# Patient Record
Sex: Male | Born: 2000 | Race: White | Hispanic: No | Marital: Single | Smoking: Never smoker
Health system: Southern US, Community
[De-identification: ages and names within clinical notes are randomized; demographics above are authoritative.]

## PROBLEM LIST (undated history)

## (undated) DIAGNOSIS — J45909 Unspecified asthma, uncomplicated: Secondary | ICD-10-CM

## (undated) HISTORY — DX: Unspecified asthma, uncomplicated: J45.909

## (undated) HISTORY — PX: TYMPANOSTOMY: SHX2586

## (undated) HISTORY — PX: CIRCUMCISION: SUR203

## (undated) HISTORY — PX: TYMPANOSTOMY TUBE PLACEMENT: SHX32

---
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
## 2020-06-22 NOTE — Progress Notes (Signed)
Phone [phone] In person visit   Subjective:   Terry Logan is a 20 y.o. year old very pleasant male patient who presents for/with See problem oriented charting Chief Complaint  Patient presents with  . Urinary Frequency    Pt c/o frequency with urination x 3 weeks off and on. Denies back pain, fever or chills.   This visit occurred during the SARS-CoV-2 public health emergency.  Safety protocols were in place, including screening questions prior to the visit, additional usage of staff PPE, and extensive cleaning of exam room while observing appropriate contact time as indicated for disinfecting solutions.   Past Medical History-  Patient Active Problem List   Diagnosis Date Noted  . Mild concussion 11/19/2017  . Gynecomastia 08/14/2017  . 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 Asthma     Medications- reviewed and updated Current Outpatient Medications  Medication Sig Dispense Refill  . clindamycin (CLEOCIN-T) 1 % external solution Apply topically 2 (two) times daily. Apply to buttock area 180 mL 2   No current facility-administered medications for this visit.     Objective:  BP 122/80 (BP Location: Left Arm, Patient Position: Sitting, Cuff Size: Normal)   Pulse 82   Temp 98.4 F (36.9 C) (Temporal)   Ht 6' (1.829 m)   Wt 197 lb 4 oz (89.5 kg)   SpO2 97%   BMI 26.75 kg/m  Gen: NAD, resting comfortably CV: RRR no murmurs rubs or gallops Lungs: CTAB no crackles, wheeze, rhonchi Abdomen: soft/nontender/nondistended/normal bowel sounds. No rebound or guarding.  Ext: no edema Skin: warm, dry, multiple papules and scarred lesions throughout buttocks-a few scattered lesions in the groin-patient reports abscesses that come and go Rectal: normal tone, normal sized prostate, no masses or tenderness   Results for orders placed or performed in visit on 06/23/20 (from the past 24 hour(s))  POCT urinalysis dipstick     Status: Normal   Collection Time: 06/23/20  3:11 PM  Result  Value Ref Range   Color, UA yellow    Clarity, UA clear    Glucose, UA Negative Negative   Bilirubin, UA negative    Ketones, UA negative    Spec Grav, UA 1.020 1.010 - 1.025   Blood, UA negative    pH, UA 6.0 5.0 - 8.0   Protein, UA Negative Negative   Urobilinogen, UA 0.2 0.2 or 1.0 E.U./dL   Nitrite, UA negative    Leukocytes, UA Negative Negative   Appearance     Odor         Assessment and Plan   #Social update- 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 somedays can have normal amount of urination and then other days back to the every 30 minutes. About the same as wen it started - some right low back pain but not new Complains of dysuria: no; polyuria: yes; nocturia: perhaps once a night before he can fall asleep which is abnormal; urgency: no. - no penile discharge. Dating but not sexually active- no oral or vaginal intercourse.    ROS- no fever, chills, nausea, vomiting, flank pain. No blood in urine.  A/P: UA reassuring.  -he is worried about diabetes-we will check A1c with urinary frequency -Has had some low back pain since football years-we will  also check CBC and CMP -We will get urine culture to rule out infection.  Hold off on antibiotics at this time -Prostate examination without tenderness or bogginess-do not suspect prostatitis at present Patient to follow up if new or worsening symptoms or failure to improve.   #Possible hidradenitis suppurativa-trial clindamycin for multiple papules and abscesses over time antibiotics- could consider oral antibiotics if not improving but will give at least a month  #Intentional weight loss-congratulated patient on being down 50 pounds from 2020-this has been intentional working  out regularly multiple times a week for an hour, watching what he eats- less portion size.  No recent significant worsening since he started the frequent urination- do not strongly suspect diabetes   Recommended follow up: Return for as needed for new, worsening, persistent symptoms. Future Appointments  Date Time Provider Department Center  12/15/2020  2:40 PM Shelva Majestic, MD [facility]    Lab/Order associations:   ICD-10-CM   1. Frequent urination  R35.0 Urine Culture    POCT urinalysis dipstick    CBC with Differential/Platelet    Comprehensive metabolic panel    Hemoglobin A1c    Hemoglobin A1c    Comprehensive metabolic panel    CBC with Differential/Platelet    CANCELED: CBC with Differential/Platelet    CANCELED: Comprehensive metabolic panel    CANCELED: Hemoglobin A1c  2. Chronic right-sided low back pain without sciatica  M54.50 CBC with Differential/Platelet   G89.29 Comprehensive metabolic panel    Comprehensive metabolic panel    CBC with Differential/Platelet    CANCELED: CBC with Differential/Platelet    CANCELED: Comprehensive metabolic panel    Meds ordered this encounter  Medications  . clindamycin (CLEOCIN-T) 1 % external solution    Sig: Apply topically 2 (two) times daily. Apply to buttock area    Dispense:  180 mL    Refill:  2    Time Spent: 22 minutes of total time (3: 40 PM- 4:02 PM) was spent on the date of the encounter performing the following actions: chart review prior to seeing the patient, obtaining history, performing a medically necessary exam, counseling on the treatment plan, placing orders, and documenting in our EHR.   Return precautions advised.  Tana Conch, MD

## 2020-06-22 NOTE — Patient Instructions (Addendum)
Health Maintenance Due  Topic Date Due  . COVID-19 Vaccine (3 - Booster)- send [place] the date of #3 if you can  10/09/2019   congrats on weight loss!   Please stop by lab before you go If you have mychart- we will send your results within 3 business days of [place] receiving them.  If you do not have mychart- we will call you about results within 5 business days of [place] receiving them.  *please also note that you will see labs on mychart as soon as they post. I will later go in and write notes on them- will say "notes from Dr. Durene Cal"

## 2020-06-23 ENCOUNTER — Encounter: Payer: Self-pay | Admitting: Family Medicine

## 2020-06-23 ENCOUNTER — Ambulatory Visit (INDEPENDENT_AMBULATORY_CARE_PROVIDER_SITE_OTHER): Payer: 59 | Admitting: Family Medicine

## 2020-06-23 ENCOUNTER — Other Ambulatory Visit: Payer: Self-pay

## 2020-06-23 VITALS — BP 122/80 | HR 82 | Temp 98.4°F | Ht 72.0 in | Wt 197.2 lb

## 2020-06-23 DIAGNOSIS — G8929 Other chronic pain: Secondary | ICD-10-CM

## 2020-06-23 DIAGNOSIS — R350 Frequency of micturition: Secondary | ICD-10-CM

## 2020-06-23 DIAGNOSIS — M545 Low back pain, unspecified: Secondary | ICD-10-CM

## 2020-06-23 LAB — POCT URINALYSIS DIPSTICK
Bilirubin, UA: NEGATIVE
Blood, UA: NEGATIVE
Glucose, UA: NEGATIVE
Ketones, UA: NEGATIVE
Leukocytes, UA: NEGATIVE
Nitrite, UA: NEGATIVE
Protein, UA: NEGATIVE
Spec Grav, UA: 1.02 (ref 1.010–1.025)
Urobilinogen, UA: 0.2 E.U./dL
pH, UA: 6 (ref 5.0–8.0)

## 2020-06-23 MED ORDER — CLINDAMYCIN PHOSPHATE 1 % EX SOLN: Freq: Two times a day (BID) | CUTANEOUS | 2 refills | Status: AC

## 2020-06-24 LAB — COMPREHENSIVE METABOLIC PANEL
AG Ratio: 1.7 (calc) (ref 1.0–2.5)
ALT: 10 U/L (ref 9–46)
AST: 12 U/L (ref 10–40)
Albumin: 4.7 g/dL (ref 3.6–5.1)
Alkaline phosphatase (APISO): 50 U/L (ref 36–130)
BUN: 14 mg/dL (ref 7–25)
CO2: 23 mmol/L (ref 20–32)
Calcium: 9.6 mg/dL (ref 8.6–10.3)
Chloride: 104 mmol/L (ref 98–110)
Creat: 1.3 mg/dL (ref 0.60–1.35)
Globulin: 2.8 g/dL (calc) (ref 1.9–3.7)
Glucose, Bld: 69 mg/dL (ref 65–99)
Potassium: 4.1 mmol/L (ref 3.5–5.3)
Sodium: 140 mmol/L (ref 135–146)
Total Bilirubin: 0.7 mg/dL (ref 0.2–1.2)
Total Protein: 7.5 g/dL (ref 6.1–8.1)

## 2020-06-24 LAB — CBC WITH DIFFERENTIAL/PLATELET
Absolute Monocytes: 848 cells/uL (ref 200–950)
Basophils Absolute: 53 cells/uL (ref 0–200)
Basophils Relative: 0.5 %
Eosinophils Absolute: 42 cells/uL (ref 15–500)
Eosinophils Relative: 0.4 %
HCT: 46.4 % (ref 38.5–50.0)
Hemoglobin: 15.2 g/dL (ref 13.2–17.1)
Lymphs Abs: 2682 cells/uL (ref 850–3900)
MCH: 26.6 pg — ABNORMAL LOW (ref 27.0–33.0)
MCHC: 32.8 g/dL (ref 32.0–36.0)
MCV: 81.1 fL (ref 80.0–100.0)
MPV: 10.8 fL (ref 7.5–12.5)
Monocytes Relative: 8 %
Neutro Abs: 6975 cells/uL (ref 1500–7800)
Neutrophils Relative %: 65.8 %
Platelets: 293 10*3/uL (ref 140–400)
RBC: 5.72 10*6/uL (ref 4.20–5.80)
RDW: 13.4 % (ref 11.0–15.0)
Total Lymphocyte: 25.3 %
WBC: 10.6 10*3/uL (ref 3.8–10.8)

## 2020-06-24 LAB — HEMOGLOBIN A1C
Hgb A1c MFr Bld: 5.1 % of total Hgb (ref <5.7)
Mean Plasma Glucose: 100 mg/dL
eAG (mmol/L): 5.5 mmol/L

## 2020-06-24 LAB — URINE CULTURE
MICRO NUMBER:: 11943791
Result:: NO GROWTH
SPECIMEN QUALITY:: ADEQUATE

## 2020-11-11 ENCOUNTER — Ambulatory Visit
Admission: EM | Admit: 2020-11-11 | Discharge: 2020-11-11 | Disposition: A | Discharge status: Home / Self Care | Payer: 59 | Source: Ambulatory Visit

## 2020-11-11 ENCOUNTER — Emergency Department (HOSPITAL_COMMUNITY)
Admission: EM | Admit: 2020-11-11 | Discharge: 2020-11-12 | Disposition: A | Discharge status: Home / Self Care | Payer: 59 | Attending: Emergency Medicine | Admitting: Emergency Medicine

## 2020-11-11 ENCOUNTER — Ambulatory Visit
Admission: RE | Admit: 2020-11-11 | Discharge: 2020-11-11 | Disposition: A | Discharge status: Home / Self Care | Payer: 59 | Source: Ambulatory Visit

## 2020-11-11 ENCOUNTER — Encounter (HOSPITAL_COMMUNITY): Payer: Self-pay | Admitting: Emergency Medicine

## 2020-11-11 ENCOUNTER — Other Ambulatory Visit: Payer: Self-pay

## 2020-11-11 DIAGNOSIS — R197 Diarrhea, unspecified: Secondary | ICD-10-CM | POA: Insufficient documentation

## 2020-11-11 DIAGNOSIS — R140 Abdominal distension (gaseous): Secondary | ICD-10-CM | POA: Insufficient documentation

## 2020-11-11 DIAGNOSIS — F17210 Nicotine dependence, cigarettes, uncomplicated: Secondary | ICD-10-CM | POA: Diagnosis not present

## 2020-11-11 DIAGNOSIS — R143 Flatulence: Secondary | ICD-10-CM | POA: Insufficient documentation

## 2020-11-11 DIAGNOSIS — R1033 Periumbilical pain: Secondary | ICD-10-CM | POA: Diagnosis present

## 2020-11-11 DIAGNOSIS — R202 Paresthesia of skin: Secondary | ICD-10-CM | POA: Diagnosis not present

## 2020-11-11 LAB — COMPREHENSIVE METABOLIC PANEL
ALT: 15 U/L (ref 0–44)
AST: 15 U/L (ref 15–41)
Albumin: 4.3 g/dL (ref 3.5–5.0)
Alkaline Phosphatase: 50 U/L (ref 38–126)
Anion gap: 10 (ref 5–15)
BUN: 9 mg/dL (ref 6–20)
CO2: 21 mmol/L — ABNORMAL LOW (ref 22–32)
Calcium: 9.4 mg/dL (ref 8.9–10.3)
Chloride: 107 mmol/L (ref 98–111)
Creatinine, Ser: 1.11 mg/dL (ref 0.61–1.24)
GFR, Estimated: >60 mL/min (ref >60)
Glucose, Bld: 102 mg/dL — ABNORMAL HIGH (ref 70–99)
Potassium: 3.6 mmol/L (ref 3.5–5.1)
Sodium: 138 mmol/L (ref 135–145)
Total Bilirubin: 0.5 mg/dL (ref 0.3–1.2)
Total Protein: 7.6 g/dL (ref 6.5–8.1)

## 2020-11-11 LAB — URINALYSIS, ROUTINE W REFLEX MICROSCOPIC
Bilirubin Urine: NEGATIVE
Glucose, UA: NEGATIVE mg/dL
Hgb urine dipstick: NEGATIVE
Ketones, ur: NEGATIVE mg/dL
Leukocytes,Ua: NEGATIVE
Nitrite: NEGATIVE
Protein, ur: NEGATIVE mg/dL
Specific Gravity, Urine: 1.018 (ref 1.005–1.030)
pH: 5 (ref 5.0–8.0)

## 2020-11-11 LAB — CBC WITH DIFFERENTIAL/PLATELET
Abs Immature Granulocytes: 0.05 10*3/uL (ref 0.00–0.07)
Basophils Absolute: 0.1 10*3/uL (ref 0.0–0.1)
Basophils Relative: 0 %
Eosinophils Absolute: 0 10*3/uL (ref 0.0–0.5)
Eosinophils Relative: 0 %
HCT: 44.2 % (ref 39.0–52.0)
Hemoglobin: 15 g/dL (ref 13.0–17.0)
Immature Granulocytes: 0 %
Lymphocytes Relative: 26 %
Lymphs Abs: 3.2 10*3/uL (ref 0.7–4.0)
MCH: 28.4 pg (ref 26.0–34.0)
MCHC: 33.9 g/dL (ref 30.0–36.0)
MCV: 83.6 fL (ref 80.0–100.0)
Monocytes Absolute: 0.8 10*3/uL (ref 0.1–1.0)
Monocytes Relative: 6 %
Neutro Abs: 8.1 10*3/uL — ABNORMAL HIGH (ref 1.7–7.7)
Neutrophils Relative %: 68 %
Platelets: 308 10*3/uL (ref 150–400)
RBC: 5.29 MIL/uL (ref 4.22–5.81)
RDW: 13.2 % (ref 11.5–15.5)
WBC: 12.2 10*3/uL — ABNORMAL HIGH (ref 4.0–10.5)
nRBC: 0 % (ref 0.0–0.2)

## 2020-11-11 LAB — LIPASE, BLOOD: Lipase: 30 U/L (ref 11–51)

## 2020-11-11 NOTE — ED Triage Notes (Signed)
Pt reports abd "discomfort" and decreased appetite since 10/4.  Reports facial tingling and bilateral hand tremors since 10/7.  Traveled to [facility] 10/8-10/14.

## 2020-11-11 NOTE — ED Provider Notes (Signed)
Emergency Medicine Provider Triage Evaluation Note  Terry Logan , a 20 y.o. male  was evaluated in triage.  Pt complains of diffuse abd discomfort x 2 weeks with 1 episode of emesis and looser stools. Has been experiencing intermittent tremors to hands bilaterally and a tingling sensation to his bilateral cheeks. No fevers or chills. No previous abd surgeries.   Review of Systems  Positive: + abd pain, nausea, loose stools, shaking Negative: - fevers  Physical Exam  BP (!) 147/83 (BP Location: Left Arm)   Pulse 70   Temp 98.9 F (37.2 C) (Oral)   Resp 18   SpO2 98%  Gen:   Awake, no distress   Resp:  Normal effort  MSK:   Moves extremities without difficulty  Other:  Abd soft and nontender. No tremor appreciated. A&O x 4  Medical Decision Making  Medically screening exam initiated at 4:07 PM.  Appropriate orders placed.  Nyko Gell was informed that the remainder of the evaluation will be completed by another provider, this initial triage assessment does not replace that evaluation, and the importance of remaining in the ED until their evaluation is complete.     Tanda Rockers, PA-C 11/11/20 1608    Benjiman Core, MD 11/11/20 1651

## 2020-11-11 NOTE — ED Triage Notes (Signed)
Patient presents to Urgent Care with complaints of abdominal pain, decreased appetite, nausea, bilateral hand tremors, facial tingling, and diarrhea since Oct 4th. Pt states he quit vaping oct 3rd. He associated symptoms to possible withdrawal so he starting vaping again on the 10th with no improvement in symptoms. Dad is concerned with possible onset of diabetes or if related to covid. Tested positive for covid a month ago.   Denies fever, abdominal pain, changes in vision, no blood in stool, or headache.

## 2020-11-12 ENCOUNTER — Other Ambulatory Visit: Payer: Self-pay

## 2020-11-12 MED ORDER — DICYCLOMINE HCL 20 MG PO TABS: 20.0000 mg | ORAL_TABLET | Freq: Two times a day (BID) | ORAL | 0 refills | Status: DC

## 2020-11-12 NOTE — ED Provider Notes (Signed)
[facility] Emergency Department Provider Note MRN:  [id number]  Arrival date & time: 11/12/20     Chief Complaint   Abdominal Pain   History of Present Illness   Terry Logan is a 20 y.o. year-old male with no pertinent past medical history presenting to the ED with chief complaint of abdominal pain.  Location: Periumbilical Duration: 2 weeks Onset: Gradual Timing: Intermittent Description: Bloating bubble like sensation Severity: Very mild Exacerbating/Alleviating Factors: None Associated Symptoms: Paresthesias to the hands and face intermittently, occasional diarrhea, lots of flatulence Pertinent Negatives: No fever, no nausea vomiting, no chest pain or shortness of breath  Additional History: None  Review of Systems  A complete 10 system review of systems was obtained and all systems are negative except as noted in the HPI and PMH.   Patient's Health History   History reviewed. No pertinent past medical history.  Past Surgical History:  Procedure Laterality Date   ADENOIDECTOMY  2004   CIRCUMCISION     20 year old, after adopted from [place]   TYMPANOSTOMY     TYMPANOSTOMY TUBE PLACEMENT      Family History  Adopted: Yes  Family history unknown: Yes    Social History   Socioeconomic History   Marital status: Single    Spouse name: Not on file   Number of children: Not on file   Years of education: Not on file   Highest education level: Not on file  Occupational History   Not on file  Tobacco Use   Smoking status: Every Day    Types: Cigarettes   Smokeless tobacco: Never  Vaping Use   Vaping Use: Some days  Substance and Sexual Activity   Alcohol use: Never   Drug use: Yes    Types: Marijuana   Sexual activity: Never  Other Topics Concern   Not on file  Social History Narrative   Single. 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    Social Determinants of  Health   Financial Resource Strain: Not on file  Food Insecurity: Not on file  Transportation Needs: Not on file  Physical Activity: Not on file  Stress: Not on file  Social Connections: Not on file  Intimate Partner Violence: Not on file     Physical Exam   Vitals:   11/12/20 0006 11/12/20 0220  BP: 120/83 139/90  Pulse: 62 69  Resp: 18 19  Temp: 98 F (36.7 C) 98.8 F (37.1 C)  SpO2: 100% 100%    CONSTITUTIONAL: Well-appearing, NAD NEURO:  Alert and oriented x 3, no focal deficits EYES:  eyes equal and reactive ENT/NECK:  no LAD, no JVD CARDIO: Regular rate, well-perfused, normal S1 and S2 PULM:  CTAB no wheezing or rhonchi GI/GU:  normal bowel sounds, non-distended, non-tender MSK/SPINE:  No gross deformities, no edema SKIN:  no rash, atraumatic PSYCH:  Appropriate speech and behavior  *Additional and/or pertinent findings included in MDM below  Diagnostic and Interventional Summary    EKG Interpretation  Date/Time:    Ventricular Rate:    PR Interval:    QRS Duration:   QT Interval:    QTC Calculation:   R Axis:     Text Interpretation:         Labs Reviewed  COMPREHENSIVE METABOLIC PANEL - Abnormal; Notable for the following components:      Result Value  CO2 21 (*)    Glucose, Bld 102 (*)    All other components within normal limits  CBC WITH DIFFERENTIAL/PLATELET - Abnormal; Notable for the following components:   WBC 12.2 (*)    Neutro Abs 8.1 (*)    All other components within normal limits  LIPASE, BLOOD  URINALYSIS, ROUTINE W REFLEX MICROSCOPIC    No orders to display    Medications - No data to display   Procedures  /  Critical Care Procedures  ED Course and Medical Decision Making  I have reviewed the triage vital signs, the nursing notes, and pertinent available records from the EMR.  Listed above are laboratory and imaging tests that I personally ordered, reviewed, and interpreted and then considered in my medical decision  making (see below for details).  Normal vital signs, very reassuring exam, no abdominal pain, no tenderness whatsoever on exam, normal and symmetric strength and sensation, normal coordination, normal speech, neurological exam is normal.  Favoring anxiety related symptoms and/or IBS.  Also considering lingering COVID symptoms or long COVID given patient's bout of COVID one month ago.  Other viral illness also possible.  Labs reassuring, no indication for further testing or imaging, patient is appropriate for discharge.       Elmer Sow. Pilar Plate, MD [facility] Emergency Medicine [facility] [email or web address]  Final Clinical Impressions(s) / ED Diagnoses     ICD-10-CM   1. Abdominal bloating  R14.0     2. Paresthesia  R20.2       ED Discharge Orders          Ordered    dicyclomine (BENTYL) 20 MG tablet  2 times daily        11/12/20 0232             Discharge Instructions Discussed with and Provided to Patient:    Discharge Instructions      You were evaluated in the Emergency Department and after careful evaluation, we did not find any emergent condition requiring admission or further testing in the hospital.  Your exam/testing today was overall reassuring.  Symptoms may be related to stress/anxiety or possibly irritable bowel syndrome.  Recommend close follow-up with your regular doctor, you can try the Bentyl medication for any abdominal discomfort.  Please return to the Emergency Department if you experience any worsening of your condition.  Thank you for allowing [place] to be a part of your care.        Sabas Sous, MD 11/12/20 [phone]

## 2020-11-12 NOTE — Discharge Instructions (Addendum)
You were evaluated in the Emergency Department and after careful evaluation, we did not find any emergent condition requiring admission or further testing in the hospital.  Your exam/testing today was overall reassuring.  Symptoms may be related to stress/anxiety or possibly irritable bowel syndrome.  Recommend close follow-up with your regular doctor, you can try the Bentyl medication for any abdominal discomfort.  Please return to the Emergency Department if you experience any worsening of your condition.  Thank you for allowing [place] to be a part of your care.

## 2020-12-05 ENCOUNTER — Other Ambulatory Visit: Payer: Self-pay

## 2020-12-05 ENCOUNTER — Ambulatory Visit: Payer: 59 | Admitting: Family Medicine

## 2020-12-05 ENCOUNTER — Encounter: Payer: Self-pay | Admitting: Family Medicine

## 2020-12-05 VITALS — BP 120/84 | HR 75 | Temp 97.6°F | Ht 73.0 in | Wt 198.8 lb

## 2020-12-05 DIAGNOSIS — Z23 Encounter for immunization: Secondary | ICD-10-CM | POA: Diagnosis not present

## 2020-12-05 DIAGNOSIS — F419 Anxiety disorder, unspecified: Secondary | ICD-10-CM | POA: Diagnosis not present

## 2020-12-05 DIAGNOSIS — R1033 Periumbilical pain: Secondary | ICD-10-CM

## 2020-12-05 DIAGNOSIS — R41840 Attention and concentration deficit: Secondary | ICD-10-CM

## 2020-12-05 DIAGNOSIS — F17200 Nicotine dependence, unspecified, uncomplicated: Secondary | ICD-10-CM

## 2020-12-05 NOTE — Progress Notes (Signed)
Phone [phone] In person visit   Subjective:   Terry Logan is a 20 y.o. year old very pleasant male patient who presents for/with See problem oriented charting Chief Complaint  Patient presents with   Follow-up   Bloated    No resolve with abdominal bloating.   Anxiety    Pt states this is better.    This visit occurred during the SARS-CoV-2 public health emergency.  Safety protocols were in place, including screening questions prior to the visit, additional usage of staff PPE, and extensive cleaning of exam room while observing appropriate contact time as indicated for disinfecting solutions.   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 Apply to buttock area 180 mL 2   dicyclomine (BENTYL) 20 MG tablet Take 1 tablet (20 mg total) by mouth 2 (two) times daily. 20 tablet 0   No current facility-administered medications for this visit.     Objective:  BP 120/84   Pulse 75   Temp 97.6 F (36.4 C)   Ht 6\' 1"  (1.854 m)   Wt 198 lb 12.8 oz (90.2 kg)   SpO2 99%   BMI 26.23 kg/m  Gen: NAD, resting comfortably CV: RRR no murmurs rubs or gallops Lungs: CTAB no crackles, wheeze, rhonchi Abdomen: soft/nontender/nondistended/normal bowel sounds. No rebound or guarding.  Ext: no edema Skin: warm, dry     Assessment and Plan   # ED F/U for Abdominal Bloating S:Patient presented to the ED on 11/11/2020  (started with urgent care but was advised to go to ER) with a chief complaint of abdominal pain for 2 weeks located at the periumbilical region. Intermittent pain and very mild in severity. Patient states he felt a bloating-bubbly sensation. Associated symptoms  included paresthesias to the hands and face intermittently, occasional diarrhea, lots of flatulence, shakiness.  CBC, CMP, lipase largely reassuring.  CO2 slightly low potentially related to anxiety/hyperventilation. Did have covid month prior. All this happened right before being in a rush to get out to [place].   Vitals signs were normal, physical exam was reassuring -  no abdominal pain, no tenderness whatsoever on exam, normal and symmetric strength and sensation, normal coordination, normal speech, neurological exam was also normal. Though to be favoring anxiety related symptoms and/or IBS.  Also considered lingering COVID symptoms or long COVID given patient had COVID  sometime bck in September. Other viral illness was also a possibility. Labs were reassuring - there was no indication for further testing or imaging.Patient was recommended to have a  close follow-up with PCP and he could try the Bentyl 20 mg BID that was prescribed at discharge for any abdominal discomfort.  Today patient reports  -shakiness and tingling episodes resolve within 10 minutes with 3-4 total since 15th and decreasing in frequency- last one was last week before a big exam. Similar symptoms in feet with panic attacks -abdominal bloating comes and goes- worse on stressful mornings ranging from 15 mins to 2 hours. Also gets some discomfort in abdomen/crampy and bowel movement improves symptoms. Dicyclomine has been helpful. Hydration has helped.  - no blood in stool or dark black stool.  A/P: 20 year old male with lower abdominal pain/periumbilical abdominal pain/cramping that seems  to worsen with stress and associated with bloating but relieved by bowel movements concerning for potential irritable bowel syndrome.  The fact that he has improvement with Bentyl is certainly a good sign.  Reassuring blood work in the emergency department.  No red flags today. -symptoms are mild enough that he wants to hold off on GI consult- if he  has new or worsening symptoms soudl let us know particularly blood in stool or dark black stool.  - can refill bentyl if needed for him.   In regards to periods of shakiness and numbness in the hands-these episodes were typically around 10 minutes and not persistent-doubt something like multiple sclerosis or stroke.  Certainly could be linked to anxiety/stress.  Has had several episodes in the last month but seem to be decreasing in frequency.  We encouraged him to follow-up with [place] with new or worsening symptoms or persistent symptoms. - doing therapy still with school and wants to hold off on medicine.   #Vaping-under 100 cigarettes in lifetime. Has vaped though - pod a day most days.  patient stopped vaping nicotine back in October 2021. Has gone back to vaping more recently but wants to try to quit over Christmas again. Encouraged full cessation  - we had erroneously written former smoker for cigarettes -occasional marijuana- recommended against this.    # Anxiety/Inattention/ADHD concern S:Medication: none at present  - for inattention Last year patient reported having difficulty focusing - increased when he started college. - recommended Dr. Reggy Eye since he specialized in ADD and other issues such as anxiety - he still wants to hold off on this right now.   - for anxiety- see discussion above about potential IBS symptoms Counseling: currently with [employer] GAD 7 : Generalized Anxiety Score 12/05/2020 11/18/2019  Nervous, Anxious, on Edge 2 1  Control/stop worrying 2 2  Worry too much - different things 1 1  Trouble relaxing 2 2  Restless 2 1  Easily annoyed or irritable 2 3  Afraid - awful might happen 1 2  Total GAD 7 Score 12 12  Anxiety Difficulty Somewhat difficult Somewhat difficult  A/P: imperfect control but does not want to pursue meds at this point . Wants to continue with counseling though  # Urinary Frequency - concern for UTI or diabetes - cut down on water and doing  better. Prior workup was reassuring.   #Possible hidradenitis suppurativa-trial clindamycin for multiple papules and abscesses over time antibiotics last visit- doing well with use when needed  #Intentional weight loss- last visit patient was down 50 pounds from 2020-has done a great job maintaining weight loss since last visit.  - doing club swim with [employer]  Recommended follow up: Scheduled for physical in 10 days-he is going to go home and see if this is required Future Appointments  Date Time Provider Department Center  12/15/2020  2:40 PM Shelva Majestic, MD [facility]    Lab/Order associations:   ICD-10-CM   1. Periumbilical abdominal pain  R10.33     2. 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 Need for immunization against influenza  Z23 Flu Vaccine QUAD 68mo+IM (Fluarix, Fluzone & Alfiuria Quad PF)    4. Anxiety  F41.9     5. Inattention  R41.840       Time Spent: 31 minutes of total time (1:53 PM- 2:24 PM) was spent on the date of the encounter performing the following actions: chart review prior to seeing the patient,  obtaining history, performing a medically necessary exam, counseling on the treatment plan, placing orders, and documenting in our EHR.    No orders of the defined types were placed in this encounter.  I,Harris Phan,acting as a Neurosurgeon for Tana Conch, MD.,have documented all relevant documentation on the behalf of Tana Conch, MD,as directed by  Tana Conch, MD while in the presence of Tana Conch, MD.   I, Tana Conch, MD, have reviewed all documentation for this visit. The documentation on 12/05/20 for the exam, diagnosis, procedures, and orders are all accurate and complete.   Return precautions advised.  Tana Conch, MD

## 2020-12-05 NOTE — Patient Instructions (Addendum)
Health Maintenance Due  Topic Date Due   COVID-19 Vaccine (3 - Booster) -Consider getting Omicron/Bivalent booster at your local pharmacy! Please let us know when you have received this vaccination.  We do not have access to this vaccination in our office 07/04/2019   Thanks for getting your flu shot completed today!  For your abdominal pain, if you find yourself having any new or worsening symptoms please let me know - particularly let us know if you have any dark-black stools.   Somewhat irritable bowel type symptoms-stress management, regular exercise be beneficial  Recommended follow up: He was scheduled for a physical on November 18-he wanted to check her schedule and see if this is required for [facility] before moving this out 6 months-I think 6 months is reasonable if not required for present time

## 2020-12-15 ENCOUNTER — Encounter: Payer: Managed Care, Other (non HMO) | Admitting: Family Medicine

## 2021-03-05 ENCOUNTER — Encounter: Payer: Self-pay | Admitting: Family Medicine

## 2021-10-22 ENCOUNTER — Encounter: Payer: Self-pay | Admitting: *Deleted

## 2022-01-10 ENCOUNTER — Encounter: Payer: Self-pay | Admitting: *Deleted

## 2022-09-12 ENCOUNTER — Encounter (INDEPENDENT_AMBULATORY_CARE_PROVIDER_SITE_OTHER): Payer: Self-pay

## 2022-10-03 ENCOUNTER — Encounter: Payer: Self-pay | Admitting: Family Medicine

## 2022-10-03 ENCOUNTER — Ambulatory Visit (INDEPENDENT_AMBULATORY_CARE_PROVIDER_SITE_OTHER): Payer: 59 | Admitting: Family Medicine

## 2022-10-03 VITALS — BP 120/74 | HR 73 | Temp 97.0°F | Ht 73.0 in | Wt 241.0 lb

## 2022-10-03 DIAGNOSIS — Z130 Encounter for screening for diseases of the blood and blood-forming organs and certain disorders involving the immune mechanism: Secondary | ICD-10-CM

## 2022-10-03 DIAGNOSIS — Z13220 Encounter for screening for lipoid disorders: Secondary | ICD-10-CM

## 2022-10-03 DIAGNOSIS — Z0000 Encounter for general adult medical examination without abnormal findings: Secondary | ICD-10-CM

## 2022-10-03 DIAGNOSIS — Z131 Encounter for screening for diabetes mellitus: Secondary | ICD-10-CM | POA: Diagnosis not present

## 2022-10-03 DIAGNOSIS — E669 Obesity, unspecified: Secondary | ICD-10-CM | POA: Diagnosis not present

## 2022-10-03 DIAGNOSIS — Z23 Encounter for immunization: Secondary | ICD-10-CM

## 2022-10-03 LAB — CBC WITH DIFFERENTIAL/PLATELET
Basophils Absolute: 0 10*3/uL (ref 0.0–0.1)
Basophils Relative: 0.5 % (ref 0.0–3.0)
Eosinophils Absolute: 0.2 10*3/uL (ref 0.0–0.7)
Eosinophils Relative: 2.6 % (ref 0.0–5.0)
HCT: 44 % (ref 39.0–52.0)
Hemoglobin: 14.5 g/dL (ref 13.0–17.0)
Lymphocytes Relative: 28.7 % (ref 12.0–46.0)
Lymphs Abs: 2 10*3/uL (ref 0.7–4.0)
MCHC: 33.1 g/dL (ref 30.0–36.0)
MCV: 83.3 fl (ref 78.0–100.0)
Monocytes Absolute: 0.6 10*3/uL (ref 0.1–1.0)
Monocytes Relative: 7.9 % (ref 3.0–12.0)
Neutro Abs: 4.3 10*3/uL (ref 1.4–7.7)
Neutrophils Relative %: 60.3 % (ref 43.0–77.0)
Platelets: 236 10*3/uL (ref 150.0–400.0)
RBC: 5.28 Mil/uL (ref 4.22–5.81)
RDW: 13.6 % (ref 11.5–15.5)
WBC: 7 10*3/uL (ref 4.0–10.5)

## 2022-10-03 LAB — COMPREHENSIVE METABOLIC PANEL
ALT: 32 U/L (ref 0–53)
AST: 28 U/L (ref 0–37)
Albumin: 4.1 g/dL (ref 3.5–5.2)
Alkaline Phosphatase: 58 U/L (ref 39–117)
BUN: 10 mg/dL (ref 6–23)
CO2: 26 meq/L (ref 19–32)
Calcium: 9.3 mg/dL (ref 8.4–10.5)
Chloride: 105 meq/L (ref 96–112)
Creatinine, Ser: 1.04 mg/dL (ref 0.40–1.50)
GFR: 101.9 mL/min (ref >60.00)
Glucose, Bld: 80 mg/dL (ref 70–99)
Potassium: 4 meq/L (ref 3.5–5.1)
Sodium: 138 meq/L (ref 135–145)
Total Bilirubin: 0.5 mg/dL (ref 0.2–1.2)
Total Protein: 7.5 g/dL (ref 6.0–8.3)

## 2022-10-03 LAB — LIPID PANEL
Cholesterol: 209 mg/dL — ABNORMAL HIGH (ref 0–200)
HDL: 54.3 mg/dL (ref >39.00)
LDL Cholesterol: 131 mg/dL — ABNORMAL HIGH (ref 0–99)
NonHDL: 154.36
Total CHOL/HDL Ratio: 4
Triglycerides: 116 mg/dL (ref 0.0–149.0)
VLDL: 23.2 mg/dL (ref 0.0–40.0)

## 2022-10-03 LAB — HEMOGLOBIN A1C: Hgb A1c MFr Bld: 5.3 % (ref 4.6–6.5)

## 2022-10-03 NOTE — Progress Notes (Signed)
Phone: [phone]    Subjective:  Patient presents today for their annual physical. Chief complaint-noted.   See problem oriented charting- ROS- full  review of systems was completed and negative  except for: slightly down mood and anhedonia, some anxiety, did have twinge of left lower chest pain a few days ago lasting for 3 minutes then resolved - was resting- has not had recurrence- will see [place] back if recurs  The following were reviewed and entered/updated in epic: History reviewed. No pertinent past medical history. 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 Apply to buttock area (Patient not taking: Reported on 10/03/2022) 180 mL 2   No current facility-administered medications for this visit.    Allergies-reviewed and updated No Known Allergies  Social History   Social History Narrative   Single. 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 Oropharynx normal Neck: no thyromegaly CV: RRR no murmurs rubs or gallops Lungs: CTAB no crackles, wheeze, rhonchi Abdomen:  soft/nontender/nondistended/normal bowel sounds. No rebound or guarding.  Ext: no edema Skin: warm, dry Neuro: grossly normal, moves all extremities, PERRLA     Assessment and Plan:  22 y.o. male presenting for annual physical.  Health Maintenance counseling: 1. Anticipatory guidance: Patient counseled regarding regular dental exams - advised q6 months, eye exams - yearly but needs to schedule,  avoiding smoking and second hand smoke , limiting alcohol to 2 beverages per day- 4 a week, no illicit drugs- marijuana years ago.   2. Risk factor reduction:  Advised patient of need for regular exercise and diet rich and fruits and vegetables to reduce risk of heart attack and stroke.  Exercise- fitness plus on apple 2-3 days a week.  Diet/weight management-weight up 43 lbs in last 2 years - feels he has been overeating at home.  Wt Readings from Last 3 Encounters:  10/03/22 241 lb (109.3 kg)  12/05/20 198 lb 12.8 oz (90.2 kg)  11/12/20 200 lb (90.7 kg)  3. Immunizations/screenings/ancillary studies- Tetanus, Diphtheria, and Pertussis (Tdap) and flu today  , COVID - opts out   Immunization History  Administered Date(s) Administered   DTaP 02/06/2001, 03/20/2001, 06/24/2001, 03/22/2002, 05/17/2004   HIB (PRP-OMP) 02/06/2001, 03/20/2001, 06/24/2001   Hepatitis A 09/03/2005, 04/22/2006   Hepatitis B 02/06/2001, 03/20/2001, 03/22/2002   Hpv-Unspecified 09/03/2005, 11/22/2014, 08/30/2015   IPV 02/06/2001, 03/20/2001, 06/24/2001, 05/17/2004   Influenza,inj,Quad PF,6+ Mos 11/24/2017, 11/18/2019,  12/05/2020, 10/03/2022   Influenza-Unspecified 10/11/2008, 11/28/2009, 12/24/2010, 10/29/2011, 11/12/2012, 11/22/2014   MMR 03/20/2001, 05/17/2004   Meningococcal B, OMV 09/03/2016, 08/14/2017, 07/16/2018   Meningococcal Conjugate 11/19/2012   Meningococcal Mcv4o 07/16/2018   PFIZER(Purple Top)SARS-COV-2 Vaccination 04/25/2019, 05/09/2019   PPD Test 07/27/2018   Pneumococcal Conjugate-13 03/20/2001,  06/24/2001   Td 12/24/2010   Tdap 12/24/2010, 10/03/2022   Varicella 06/24/2001, 09/03/2005   4. Prostate cancer screening- unknown history as adopted - will start at age 64 likely  5. Colon cancer screening - no family history, start at age 69 6. Skin cancer screening/prevention- no dermatologist. advised regular sunscreen use. Denies worrisome, changing, or new skin lesions.   7. Testicular cancer screening- advised monthly self exams   8. STD screening- patient opts out- not active at present- abstinence current plan  9. Never smoker  Status of chronic or acute concerns   #some feeling of being down and some angst mainly related to job search issues  # Prior gynecomastia improved with weight loss in adolescent years- even with wegiht gain has not returned  # Screening for hyperlipidemia-he agrees to baseline lipids today   # Asthma-no issues since childhood    Recommended follow up: Return in about 1 year (around 10/03/2023) for physical or sooner if needed.Schedule b4 you leave.  Lab/Order associations: fasting   ICD-10-CM   1. Preventative health care  Z00.00 Comprehensive metabolic panel    CBC with Differential/Platelet    Lipid panel    Hemoglobin A1c    2. Need for Tdap vaccination  Z23 Tdap vaccine greater than or equal to 7yo IM    3. Need for vaccination for H flu type B  Z23 Flu Vaccine QUAD 6+ mos PF IM (Fluarix Quad PF)    4. Screening for hyperlipidemia  Z13.220 Comprehensive metabolic panel    Lipid panel    5. Screening for deficiency anemia  Z13.0 CBC with Differential/Platelet    6. Screening for diabetes mellitus  Z13.1 Hemoglobin A1c    7. Obesity (BMI 30-39.9)  E66.9 Hemoglobin A1c      No orders of the defined types were placed in this encounter.   Return precautions advised.   Tana Conch, MD

## 2022-10-03 NOTE — Addendum Note (Signed)
Addended byZoe Lan on: 10/03/2022 01:32 PM   Modules accepted: Orders

## 2022-10-03 NOTE — Patient Instructions (Addendum)
Let us know if you get a COVID vaccine.  I suggest myfitnesspal Use 0.5-1 pounds per week weight loss goal Set a reasonable goal such as 5-10 lbs and can reset goal once you reach it Do not connect your step counter to this- watch or phone Update me in 2-3 months with how you are doing  Try to bump exercise to 5 days a week  Please stop by lab before you go If you have mychart- we will send your results within 3 business days of [place] receiving them.  If you do not have mychart- we will call you about results within 5 business days of [place] receiving them.  *please also note that you will see labs on mychart as soon as they post. I will later go in and write notes on them- will say "notes from Dr. Durene Cal"   Recommended follow up: Return in about 1 year (around 10/03/2023) for physical or sooner if needed.Schedule b4 you leave.

## 2023-01-08 ENCOUNTER — Encounter: Payer: Self-pay | Admitting: Family Medicine

## 2023-07-18 ENCOUNTER — Ambulatory Visit: Admitting: Licensed Clinical Social Worker

## 2023-07-25 ENCOUNTER — Ambulatory Visit: Admitting: Licensed Clinical Social Worker

## 2023-07-25 DIAGNOSIS — F4323 Adjustment disorder with mixed anxiety and depressed mood: Secondary | ICD-10-CM

## 2023-07-25 NOTE — Progress Notes (Addendum)
 [facility] Counselor/Therapist Progress Note  Patient ID: Terry Logan, MRN: [id number]    Date: 07/25/23   Time Spent: 1000  am - 1055 am : 55 Minutes  Treatment Type: Initial Assessment and Treatment Planning  Morene Christians participated for his session via video from his home. Patient is aware of risk and limitations and patient consented to treatment. Clinician presented for session via video from her home office. Presenting Problem Chief Complaint: Patient reports that he is at a weird place. He says that on paper he is doing everything right but feels a bit lost and some imposter syndrome. States that he has insecurity from living at home and his friends are moving on and he feels stuck.   What are the main stressors in your life right now, how long? Depression  3, Anxiety   3, Mood Swings  3, Appetite Change   2, Racing Thoughts   3, Confusion   3, Memory Problems   3, Loss of Interest   3, Irritability   3, Excessive Worrying   3, and Low Energy   3   Previous mental health services Have you ever been treated for a mental health problem, when, where, by whom? Yes  Patient reports having therapy in the past at [facility] at the [facility] [phone])   Are you currently seeing a therapist or counselor, counselor's name? No   Have you ever had a mental health hospitalization, how many times, length of stay? No   Have you ever been treated with medication, name, reason, response? No   Have you ever had suicidal thoughts or attempted suicide, when, how? No Patient reports that he has had dark thoughts but could never do that to his family.  Risk factors for Suicide Demographic factors:  Male and Caucasian Current mental status: No plan to harm self or others Loss factors: NA Historical factors: NA Risk Reduction factors: Sense of responsibility to family, Religious beliefs about death, Employed, Living with another person, especially a relative, and  Positive social support Clinical factors:  Severe Anxiety and/or Agitation Depression:   Moderate Cognitive features that contribute to risk: NA    SUICIDE RISK:  Minimal: No identifiable suicidal ideation.  Patients presenting with no risk factors but with morbid ruminations; may be classified as minimal risk based on the severity of the depressive symptoms  Medical history Medical treatment and/or problems, explain: NA  Do you have any issues with chronic pain?  Yes Lower Back but not debilitating  Name of primary care physician/last physical exam: Dr. Elspeth [facility]  Allergies: No Medication, reactions? NA   Current medications: Denied current meds  Prescribed by: NA  Is there any history of mental health problems or substance abuse in your family, whom? Yes Patient reports that he is adopted and is certain there are issues. Has anyone in your family been hospitalized, who, where, length of stay? No NA  Social/family history Have you been married, how many times?  0  Do you have children?  0  How many pregnancies have you had?  0  Who lives in your current household? Parents and patient.  Military history: No NA  Religious/spiritual involvement: Reports that he  is agnostic at this point. What religion/faith base are you? Agnostic  Family of origin (childhood history)  Parents and siblings.  Where were you born? [place], [place] Where did you grow up? [place]  How many different homes have you lived? 4  Describe  the atmosphere of the household where you grew up: [employer], loving and stable.  Do you have siblings, step/half siblings, list names, relation, sex, age? No NA  Are your parents separated/divorced, when and why? No Still married  Are your parents alive? Yes   Social supports (personal and professional): Parents and Lauraine  Education How many grades have you completed? college graduate Did you have any problems in  school, what type? No  Medications prescribed for these problems? No   Employment (financial issues):Works full time and denied large financial issues.   Legal history: Denied   Trauma/Abuse history:NA Have you ever been exposed to any form of abuse, what type? No NA  Have you ever been exposed to something traumatic, describe? Yes Lost a friend to a drink driver  Substance use Do you use Caffeine? Yes Type, frequency? 2 cups coffee daily,   Do you use Nicotine? No Type, frequency, ppd? NA   Do you use Alcohol? Yes Type, frequency? Rarely  How old were you went you first tasted alcohol? 17  Was this accepted by your family? Yes  When was your last drink, type, how much? Beer with dinner 2 weeks ago.  Have you ever used illicit drugs or taken more than prescribed, type, frequency, date of last usage? 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  Supports: Parents and friend Lauraine.   Goal/Needs for Treatment:  In order of importance to patient 1) I want to work on my anxiety and over thinking. 2) I want to work on my self confidence.    Client Statement of Needs: Patient reports a desire to improve overall quality of life.   Treatment Level:Bi weekly-Moderate  Symptoms: Depression, anxiety, poor sleep, irritability, over thinking and  confidence.  Client Treatment Preferences:Patient reports that he would like a combination of virtual and in person.   Healthcare consumer's goal for treatment:  Therapist, Damien Junk MSW, LCSW will support the patient's ability to achieve the goals identified. Cognitive Behavioral Therapy, Assertive Communication/Conflict Resolution Training, Relaxation Training, ACT, Humanistic and other evidenced-based practices will be used to promote progress towards healthy functioning.   Healthcare consumer will: Actively participate in therapy, working towards healthy functioning.    *Justification for Continuation/Discontinuation of Goal: R=Revised, O=Ongoing, A=Achieved, D=Discontinued  Goal 1) I want to work on my anxiety and over thinking. Baseline date 07/25/2023: Progress towards goal Ongoing; How Often - Daily Target Date Goal Was reviewed Status Code Progress towards goal/Likert rating  07/24/2024  O Ongoing             Mindfulness exercises like deep breathing and progressive muscle relaxation, grounding techniques like the 3-3-3 rule, and cognitive reframing to challenge negative thoughts. Other helpful strategies include exercise, spending time in nature, and talking to a trusted person.   Mindfulness and Relaxation: Deep Breathing: Slow, deep breaths can help calm your nervous system. Try inhaling for 4 seconds, holding  for 4 seconds, and exhaling for 6 seconds.  Progressive Muscle Relaxation: Tense and release different muscle groups to reduce physical tension associated with anxiety.  Mindfulness Meditation: Focus on the present moment without judgment, which can help manage anxious thoughts.  Guided Imagery: Imagine a peaceful scene to help calm your mind.  Yoga and Stretching: Physical activity can help release tension and improve mood.  Grounding Techniques: The 3-3-3 Rule: Name three things you see, three things you hear, and move three parts of your body.  5-4-3-2-1  Method: Identify five things you can see, four you can touch, three you can hear, two you can smell, and one you can taste.  Cognitive Techniques: Cognitive Reframing: Challenge negative or anxious thoughts by examining the evidence and considering alternative perspectives.  Journaling: Writing down your thoughts and feelings can help you process them and identify patterns.  Setting Worry Time: Schedule a specific time to address worries, which can help prevent them from taking over your day.  Other Helpful Strategies: Physical Activity: Exercise releases endorphins, which can improve mood and reduce anxiety.  Spending Time in [place]: Being outdoors can have a calming effect.  Social Support: Talking to a trusted friend or family member can provide comfort and support.  Healthy Lifestyle: Prioritizing sleep, eating a balanced diet, and limiting caffeine and alcohol can all contribute to managing anxiety according to [facility].   Goal 2) I want to work on my self confidence. Baseline date 07/25/2023: Progress towards goal Ongoing; How Often - Daily Target Date Goal Was reviewed Status Code Progress towards goal  07/24/2024  O Ongoing             1. Cognitive Restructuring (Challenging Negative Thoughts): Identify negative self-talk: . Pay attention to the thoughts that undermine your self-worth and create a list of them.  Challenge those thoughts: . Ask yourself if there's evidence to support the negative thought, or if it's an exaggeration or distortion.  Replace negative thoughts with positive affirmations: . Practice positive statements about yourself that reinforce your strengths and capabilities.  2. Self-Compassion: Treat yourself with kindness: Speak to yourself with the same compassion and understanding you would offer a friend.  Forgive yourself for mistakes: Recognize that everyone makes mistakes and that they don't define your worth.  Practice  self-care: Engage in activities that promote well-being and make you feel good, like exercise, spending time with loved ones, or pursuing hobbies.  3. Goal Setting and Accomplishments: Set realistic and achievable goals: Start with small, manageable goals that you can realistically achieve, and celebrate your successes.  Focus on your strengths: Identify your strengths and areas where you excel, and use those to build your confidence.  Celebrate your achievements: Recognize and acknowledge your accomplishments, no matter how small.  4. Building a Supportive Network: Surround yourself with positive people: Spend time with people who uplift and support you.  Seek out supportive relationships: Engage in activities that allow you to connect with others and build positive relationships.  5. Mindfulness and Acceptance: Practice mindfulness: Engage in activities like meditation or deep breathing to become more aware of your thoughts and feelings without judgment.  Accept your imperfections: Recognize that no one is perfect and that it's okay to have flaws.  6. Other Helpful Strategies: Journaling: Write down your thoughts and feelings to better understand your patterns and identify areas for improvement.  Express yourself creatively: Engage in activities like art, music, or writing to explore your emotions and build confidence.  Help others: Volunteering or helping others can boost your self-esteem and create a sense of purpose.  Say no when needed: It's okay to set boundaries and prioritize your own needs.  Challenge negative beliefs: Identify and challenge negative beliefs about yourself, replacing them with more positive and realistic ones.  This plan has been reviewed and created by the following participants:  This plan will be reviewed at least every 12 months. Date Behavioral Health Clinician Date Guardian/Patient   07/25/2023 Damien Junk MSW, LCSW  07/25/2023 Verbal Consent Provided                    Damien Junk MSW, LCSW/DATE 07/25/2023

## 2023-08-05 NOTE — Addendum Note (Signed)
 Addended by: SCHUYLER DAMIEN HERO on: 08/05/2023 02:05 PM   Modules accepted: Level of Service

## 2023-08-08 ENCOUNTER — Ambulatory Visit (INDEPENDENT_AMBULATORY_CARE_PROVIDER_SITE_OTHER): Admitting: Licensed Clinical Social Worker

## 2023-08-08 DIAGNOSIS — F4323 Adjustment disorder with mixed anxiety and depressed mood: Secondary | ICD-10-CM

## 2023-08-08 NOTE — Progress Notes (Unsigned)
 [facility] Counselor/Therapist Progress Note  Patient ID: Terry Logan, MRN: [id number]    Date: 08/08/23  Time Spent: 1002  am - 1100 am : 58 Minutes  Treatment Type: Individual Therapy.  Reported Symptoms: Patient reports that he is at a weird place. He says that on paper he is doing everything right but feels a bit lost and some imposter syndrome. States that he has insecurity from living at home and his friends are moving on and he feels stuck.     Mental Status Exam: Appearance:  Neat     Behavior: Appropriate  Motor: Normal  Speech/Language:  Clear and Coherent  Affect: Appropriate  Mood: normal  Thought process: normal  Thought content:   WNL  Sensory/Perceptual disturbances:   WNL  Orientation: oriented to person, place, time/date, situation, day of week, month of year, and year  Attention: Good  Concentration: Good  Memory: WNL  Fund of knowledge:  Good  Insight:   Good  Judgment:  Good  Impulse Control: Good   Risk Assessment: Danger to Self:  No Self-injurious Behavior: No Danger to Others: No Duty to Warn:no Physical Aggression / Violence:No  Access to Firearms a concern: No  Gang Involvement:No   Subjective:   Morene Christians participated from home, via video. Patient is aware of risk and limitations, and consented to treatment. Therapist participated from home office. We met online due to patient request.  Aristides presented for his session in a positive mood. Dietrich reports that he has been frustrated at work just due to how things go at times and the lack of insight of some of the staff where he works. Patient reports that he has been trying to be focussed on long term goals but realizes he still remains agitated at his parents at times due to their different religious and political views. He reports that moving back in with them after being gone away to college has been a difficult transition. Tevan reports that he knows he is saving  money and is grateful to his parents but also frequently recognizes the challenges of  moving back in with his parents after being on his own and how it can cause stress.  Clinician actively listened and provided support via verbal feedback. Clinician processed with patient options for being on his own and getting his own place. Patient identified that at this time it is more feasible to live with his parents and save money due to the economy being as it is. Clinician and patient processed ways to improve the situation and make it less stressful.   Patient remains motivated for treatment and engaged fully in session. Patient reports that he recognizes that he allows the stress of what is happening our country to affect his mood at times. Patient reports a desire to utilize coping skills to assist with decreasing his symptoms. Raife will continue to engage in bi weekly therapy sessions. Treatment planning will be reviewed by 07/24/2024.  Interventions: Cognitive Behavioral Therapy, Motivational Interviewing and psycho education. Clinician conducted session via video from home office. Reviewed events since last session. Assessed patient's mood since last session and current mood. Clinician reviewed diagnoses and treatment recommendations. Provided psycho education related to diagnoses and treatment.     Diagnosis: Adjustment Disorder with mixed anxiety and depression   Damien Junk MSW, LCSW/DATE 08/08/2023

## 2023-08-09 NOTE — Progress Notes (Deleted)
 [facility] Counselor/Therapist Progress Note  Patient ID: Taishaun Levels, MRN: [id number]    Date: 08/09/23  Time Spent: ***  {LBBHAMPM:26719} - *** {LBBHAMPM:26719} : *** Minutes  Treatment Type: Individual Therapy.  Reported Symptoms: ***  Mental Status Exam: Appearance:  {PSY:22683}     Behavior: {PSY:21022743}  Motor: {PSY:22302}  Speech/Language:  {PSY:22685}  Affect: {PSY:22687}  Mood: {PSY:31886}  Thought process: {PSY:31888}  Thought content:   {PSY:7201139685}  Sensory/Perceptual disturbances:   {PSY:319-508-8464}  Orientation: {PSY:30297}  Attention: {PSY:22877}  Concentration: {[phone]}  Memory: {PSY:713-403-0651}  Fund of knowledge:  {[phone]}  Insight:   {[phone]}  Judgment:  {[phone]}  Impulse Control: {[phone]}   Risk Assessment: Danger to Self:  {PSY:22692} Self-injurious Behavior: {PSY:22692} Danger to Others: {PSY:22692} Duty to Warn:{PSY:311194} Physical Aggression / Violence:{PSY:21197} Access to Firearms a concern: {PSY:21197} Gang Involvement:{PSY:21197}  Subjective:   Morene Christians participated from {Patient Location:[address]::home}, via {LBBHVIDEOORPHONE:26720}, and consented to treatment. Therapist participated from {LBBHPROVIDERLOCATION:26721}. We met online due to COVID pandemic.   ***   Interventions: {PSY:9064120091}  Diagnosis: No diagnosis found.   Plan: ***Patient is to use CBT, mindfulness and coping skills to help manage decrease symptoms associated with their diagnosis.   Long-term goal:   ***Reduce overall level, frequency, and intensity of the feelings of depression, anxiety and panic evidenced by       decreased irritability, negative self talk, and helpless feelings from 6 to 7 days/week to 0 to 1 days/week per client report for at least 3 consecutive months.  Short-term goal:  ***Verbally express understanding of the relationship between feelings of depression, anxiety and  their impact on thinking patterns and behaviors. Verbalize an understanding of the role that distorted thinking plays in creating fears, excessive worry, and ruminations.  Damien Junk MSW, LCSW/DATE

## 2023-08-25 ENCOUNTER — Ambulatory Visit: Admitting: Licensed Clinical Social Worker

## 2023-10-08 ENCOUNTER — Encounter: Payer: Self-pay | Admitting: Family Medicine

## 2023-10-08 ENCOUNTER — Ambulatory Visit (INDEPENDENT_AMBULATORY_CARE_PROVIDER_SITE_OTHER): Payer: 59 | Admitting: Family Medicine

## 2023-10-08 VITALS — BP 124/82 | HR 78 | Temp 98.1°F | Ht 73.0 in | Wt 233.2 lb

## 2023-10-08 DIAGNOSIS — Z13220 Encounter for screening for lipoid disorders: Secondary | ICD-10-CM | POA: Diagnosis not present

## 2023-10-08 DIAGNOSIS — Z130 Encounter for screening for diseases of the blood and blood-forming organs and certain disorders involving the immune mechanism: Secondary | ICD-10-CM

## 2023-10-08 DIAGNOSIS — Z23 Encounter for immunization: Secondary | ICD-10-CM | POA: Diagnosis not present

## 2023-10-08 DIAGNOSIS — Z131 Encounter for screening for diabetes mellitus: Secondary | ICD-10-CM | POA: Diagnosis not present

## 2023-10-08 DIAGNOSIS — E669 Obesity, unspecified: Secondary | ICD-10-CM | POA: Diagnosis not present

## 2023-10-08 DIAGNOSIS — Z0000 Encounter for general adult medical examination without abnormal findings: Secondary | ICD-10-CM | POA: Diagnosis not present

## 2023-10-08 DIAGNOSIS — J4520 Mild intermittent asthma, uncomplicated: Secondary | ICD-10-CM

## 2023-10-08 LAB — CBC WITH DIFFERENTIAL/PLATELET
Basophils Absolute: 0 K/uL (ref 0.0–0.1)
Basophils Relative: 0.4 % (ref 0.0–3.0)
Eosinophils Absolute: 0 K/uL (ref 0.0–0.7)
Eosinophils Relative: 0.6 % (ref 0.0–5.0)
HCT: 43.8 % (ref 39.0–52.0)
Hemoglobin: 14.6 g/dL (ref 13.0–17.0)
Lymphocytes Relative: 31.9 % (ref 12.0–46.0)
Lymphs Abs: 2.2 K/uL (ref 0.7–4.0)
MCHC: 33.3 g/dL (ref 30.0–36.0)
MCV: 83.6 fl (ref 78.0–100.0)
Monocytes Absolute: 0.6 K/uL (ref 0.1–1.0)
Monocytes Relative: 8.2 % (ref 3.0–12.0)
Neutro Abs: 4.1 K/uL (ref 1.4–7.7)
Neutrophils Relative %: 58.9 % (ref 43.0–77.0)
Platelets: 239 K/uL (ref 150.0–400.0)
RBC: 5.24 Mil/uL (ref 4.22–5.81)
RDW: 13.9 % (ref 11.5–15.5)
WBC: 7 K/uL (ref 4.0–10.5)

## 2023-10-08 LAB — COMPREHENSIVE METABOLIC PANEL WITH GFR
ALT: 18 U/L (ref 0–53)
AST: 17 U/L (ref 0–37)
Albumin: 4.7 g/dL (ref 3.5–5.2)
Alkaline Phosphatase: 50 U/L (ref 39–117)
BUN: 12 mg/dL (ref 6–23)
CO2: 29 meq/L (ref 19–32)
Calcium: 10.2 mg/dL (ref 8.4–10.5)
Chloride: 104 meq/L (ref 96–112)
Creatinine, Ser: 1.11 mg/dL (ref 0.40–1.50)
GFR: 93.57 mL/min (ref >60.00)
Glucose, Bld: 82 mg/dL (ref 70–99)
Potassium: 4.2 meq/L (ref 3.5–5.1)
Sodium: 143 meq/L (ref 135–145)
Total Bilirubin: 0.6 mg/dL (ref 0.2–1.2)
Total Protein: 8 g/dL (ref 6.0–8.3)

## 2023-10-08 LAB — LIPID PANEL
Cholesterol: 168 mg/dL (ref 0–200)
HDL: 50.8 mg/dL (ref >39.00)
LDL Cholesterol: 99 mg/dL (ref 0–99)
NonHDL: 117.21
Total CHOL/HDL Ratio: 3
Triglycerides: 92 mg/dL (ref 0.0–149.0)
VLDL: 18.4 mg/dL (ref 0.0–40.0)

## 2023-10-08 LAB — HEMOGLOBIN A1C: Hgb A1c MFr Bld: 5.5 % (ref 4.6–6.5)

## 2023-10-08 NOTE — Progress Notes (Signed)
 Phone: [phone]    Subjective:  Patient presents today for their annual physical. Chief complaint-noted.   See problem oriented charting- ROS- full  review of systems was completed and negative  except for topics noted under acute/chronic concerns  The following were reviewed and entered/updated in epic: History reviewed. No pertinent past medical history. Patient Active Problem List   Diagnosis Date Noted   Adjustment disorder with mixed anxiety and depressed mood 07/25/2023   Mild concussion 11/19/2017   Gynecomastia 08/14/2017   Asthma    Past Surgical History:  Procedure Laterality Date   ADENOIDECTOMY  2004   CIRCUMCISION     23 year old, after adopted from [place]   TYMPANOSTOMY     TYMPANOSTOMY TUBE PLACEMENT      Family History  Adopted: Yes  Family history unknown: Yes    Medications- reviewed and updated Current Outpatient Medications  Medication Sig Dispense Refill   clindamycin  (CLEOCIN -T) 1 % external solution Apply topically 2 (two) times daily. Apply to buttock area (Patient not taking: Reported on 10/08/2023) 180 mL 2   No current facility-administered medications for this visit.    Allergies-reviewed and updated No Known Allergies  Social History   Social History Narrative   Single. 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 Oropharynx normal Neck: no thyromegaly CV: RRR no murmurs rubs or gallops Lungs: CTAB no crackles, wheeze, rhonchi Abdomen:  soft/nontender/nondistended/normal bowel sounds. No rebound or guarding.  Ext: no edema Skin: warm, dry Neuro: grossly normal, moves all extremities, PERRLA Declines rectal or genitourinary concerns/exam    Assessment and Plan:  23 y.o. male presenting for annual physical.  Health Maintenance counseling: 1. Anticipatory guidance: Patient counseled regarding regular dental exams -q6 months, eye exams - yearly,  avoiding smoking and second hand smoke - prior e cigarettes but quit, limiting alcohol to 2 beverages per day-minimal 1 a week, no illicit drugs - other than some marijuana- expressed caution with asthma and lung issue potential.   2. Risk factor reduction:  Advised patient of need for regular exercise and diet rich and fruits and vegetables to reduce risk of heart attack and stroke.  Exercise- back into gym- everyday this week- trying to encourage Dad.  Diet/weight management-down 8 lbs from last year - encouraged continued weight loss- keep working towards getting back to 200 from 2022. Feels could reduce snacking. Tried counting calories but was a lot for him.  Wt Readings from Last 3 Encounters:  10/08/23 233 lb 3.2 oz (105.8 kg)  10/03/22 241 lb (109.3 kg)  12/05/20 198 lb 12.8 oz (90.2 kg)  3. Immunizations/screenings/ancillary studies- flu shot today , Prevnar 20 offered with asthma history- opts out Immunization History  Administered Date(s) Administered   DTaP 02/06/2001, 03/20/2001, 06/24/2001, 03/22/2002, 05/17/2004   HIB (PRP-OMP) 02/06/2001, 03/20/2001, 06/24/2001   Hepatitis A 09/03/2005, 04/22/2006   Hepatitis B  02/06/2001, 03/20/2001, 03/22/2002   Hpv-Unspecified 09/03/2005, 11/22/2014, 08/30/2015   IPV 02/06/2001, 03/20/2001, 06/24/2001, 05/17/2004   Influenza, Seasonal, Injecte, Preservative Fre 10/03/2022   Influenza,inj,Quad PF,6+ Mos 11/24/2017, 11/18/2019, 12/05/2020   Influenza-Unspecified 10/11/2008, 11/28/2009, 12/24/2010, 10/29/2011, 11/12/2012, 11/22/2014    MMR 03/20/2001, 05/17/2004   Meningococcal B, OMV 09/03/2016, 08/14/2017, 07/16/2018   Meningococcal Conjugate 11/19/2012   Meningococcal Mcv4o 07/16/2018   PFIZER(Purple Top)SARS-COV-2 Vaccination 04/25/2019, 05/09/2019   PPD Test 07/27/2018   Pneumococcal Conjugate-13 03/20/2001, 06/24/2001   Td 12/24/2010   Tdap 12/24/2010, 10/03/2022   Varicella 06/24/2001, 09/03/2005  4. Prostate cancer screening-  adopted so no known family history- screen starting 55  5. Colon cancer screening - adopted so no known family history- screen starting 45 6. Skin cancer screening/prevention- does not see dermatology. advised regular sunscreen use. Denies worrisome, changing, or new skin lesions.  7. Testicular cancer screening- advised monthly self exams  8. STD screening- patient opts out- abstinence  9. Smoking associated screening- never smoker  Status of chronic or acute concerns   #social update- would like to move out of town if possible- [place], [place]  #anxiety- working with therapist Damien Junk in June and July- was not overly helpful.  Feels concerned about job and dad's health. Not at point wants medicine    10/08/2023    9:20 AM 10/03/2022    9:04 AM 12/05/2020    1:37 PM 11/18/2019    9:19 AM  GAD 7 : Generalized Anxiety Score  Nervous, Anxious, on Edge 1 1 2 1   Control/stop worrying 1 1 2 2   Worry too much - different things 1 2 1 1   Trouble relaxing 0 0 2 2  Restless 0 1 2 1   Easily annoyed or irritable 3 2 2 3   Afraid - awful might happen 3 1 1 2   Total GAD 7 Score 9 8 12 12   Anxiety Difficulty Somewhat difficult Not difficult at all Somewhat difficult Somewhat difficult    #minor aches and pains at times- including at times left lower rib pain randomly even at seated position- not persistent or worsening- wants to monitor  #asthma- no recent issues  Recommended follow up: Return in about 1 year (around 10/07/2024) for physical or sooner if needed.Schedule b4 you  leave.  Lab/Order associations:NOT fasting- cream in coffee   ICD-10-CM   1. Preventative health care  Z00.00     2. Screening for hyperlipidemia  Z13.220 Comprehensive metabolic panel with GFR    Lipid panel    3. Screening for deficiency anemia  Z13.0 CBC with Differential/Platelet    4. Screening for diabetes mellitus  Z13.1 Hemoglobin A1c    5. Mild intermittent asthma, unspecified whether complicated  J45.20     6. Need for influenza vaccination  Z23 Flu vaccine trivalent PF, 6mos and older(Flulaval,Afluria,Fluarix,Fluzone)    7. Obesity (BMI 30-39.9)  E66.9 Hemoglobin A1c      No orders of the defined types were placed in this encounter.   Return precautions advised.   Garnette Lukes, MD

## 2023-10-08 NOTE — Patient Instructions (Addendum)
 Health Maintenance Due  Topic Date Due   Influenza Vaccine  08/29/2023  Today  Please stop by lab before you go If you have mychart- we will send your results within 3 business days of us  receiving them.  If you do not have mychart- we will call you about results within 5 business days of us  receiving them.  *please also note that you will see labs on mychart as soon as they post. I will later go in and write notes on them- will say notes from Dr. Katrinka   Recommended follow up: Return in about 1 year (around 10/07/2024) for physical or sooner if needed.Schedule b4 you leave.

## 2023-10-09 ENCOUNTER — Ambulatory Visit: Payer: Self-pay | Admitting: Family Medicine

## 2023-11-21 ENCOUNTER — Encounter: Payer: Self-pay | Admitting: Family Medicine

## 2024-08-10 ENCOUNTER — Encounter: Payer: Self-pay | Admitting: Family Medicine

## 2024-08-25 ENCOUNTER — Encounter: Payer: Self-pay | Admitting: Family Medicine
